# Patient Record
Sex: Female | Born: 1995 | Race: White | Hispanic: No | Marital: Single | Smoking: Never smoker
Health system: Southern US, Community
[De-identification: ages and names within clinical notes are randomized; demographics above are authoritative.]

---
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
## 2017-07-30 ENCOUNTER — Ambulatory Visit (INDEPENDENT_AMBULATORY_CARE_PROVIDER_SITE_OTHER): Payer: 59 | Admitting: Obstetrics and Gynecology

## 2017-07-30 ENCOUNTER — Other Ambulatory Visit (HOSPITAL_COMMUNITY)
Admission: RE | Admit: 2017-07-30 | Discharge: 2017-07-30 | Disposition: A | Discharge status: Home / Self Care | Payer: 59 | Source: Ambulatory Visit | Attending: Obstetrics and Gynecology | Admitting: Obstetrics and Gynecology

## 2017-07-30 ENCOUNTER — Encounter: Payer: Self-pay | Admitting: Obstetrics and Gynecology

## 2017-07-30 VITALS — BP 104/64 | HR 102 | Ht 61.0 in | Wt 111.0 lb

## 2017-07-30 DIAGNOSIS — B9689 Other specified bacterial agents as the cause of diseases classified elsewhere: Secondary | ICD-10-CM

## 2017-07-30 DIAGNOSIS — Z113 Encounter for screening for infections with a predominantly sexual mode of transmission: Secondary | ICD-10-CM

## 2017-07-30 DIAGNOSIS — N760 Acute vaginitis: Secondary | ICD-10-CM | POA: Diagnosis not present

## 2017-07-30 LAB — POCT WET PREP WITH KOH
CLUE CELLS WET PREP PER HPF POC: POSITIVE
KOH Prep POC: POSITIVE — AB
Trichomonas, UA: NEGATIVE
Yeast Wet Prep HPF POC: NEGATIVE

## 2017-07-30 MED ORDER — METRONIDAZOLE 500 MG PO TABS: 500.0000 mg | ORAL_TABLET | Freq: Two times a day (BID) | ORAL | 0 refills | Status: AC

## 2017-07-30 NOTE — Patient Instructions (Signed)
I value your feedback and entrusting us with your care. If you get a [place] patient survey, I would appreciate you taking the time to let us know about your experience today. Thank you! 

## 2017-07-30 NOTE — Progress Notes (Signed)
Patient, No Pcp Per   Chief Complaint  Patient presents with  . Vaginal Discharge    odor    HPI:      Ms. Megan Burns is a 22 y.o. No obstetric history on file. who LMP was Patient's last menstrual period was 07/16/2017 (approximate)., presents today for increased vag d/c with odor, no itch for the past 1 1/2 wks. No hx of BV in past. Has been on abx recently. No LBP, belly pain, fevers. She is sex active, with new partner. Neg STD testing within the yr.    History reviewed. No pertinent past medical history.  History reviewed. No pertinent surgical history.  History reviewed. No pertinent family history.  Social History   Socioeconomic History  . Marital status: Single    Spouse name: Not on file  . Number of children: Not on file  . Years of education: Not on file  . Highest education level: Not on file  Occupational History  . Not on file  Social Needs  . Financial resource strain: Not on file  . Food insecurity:    Worry: Not on file    Inability: Not on file  . Transportation needs:    Medical: Not on file    Non-medical: Not on file  Tobacco Use  . Smoking status: Never Smoker  . Smokeless tobacco: Never Used  Substance and Sexual Activity  . Alcohol use: No  . Drug use: No  . Sexual activity: Yes  Lifestyle  . Physical activity:    Days per week: Not on file    Minutes per session: Not on file  . Stress: Not on file  Relationships  . Social connections:    Talks on phone: Not on file    Gets together: Not on file    Attends religious service: Not on file    Active member of club or organization: Not on file    Attends meetings of clubs or organizations: Not on file    Relationship status: Not on file  . Intimate partner violence:    Fear of current or ex partner: Not on file    Emotionally abused: Not on file    Physically abused: Not on file    Forced sexual activity: Not on file  Other Topics Concern  . Not on file  Social History  Narrative  . Not on file    Outpatient Medications Prior to Visit  Medication Sig Dispense Refill  . Norgestimate-Ethinyl Estradiol Triphasic (TRI-LO-SPRINTEC) 0.18/0.215/0.25 MG-25 MCG tab Take 1 tablet by mouth daily.     No facility-administered medications prior to visit.       ROS:  Review of Systems  Constitutional: Negative for fever.  Gastrointestinal: Negative for blood in stool, constipation, diarrhea, nausea and vomiting.  Genitourinary: Positive for vaginal discharge. Negative for dyspareunia, dysuria, flank pain, frequency, hematuria, urgency, vaginal bleeding and vaginal pain.  Musculoskeletal: Negative for back pain.  Skin: Negative for rash.   BREAST: No symptoms   OBJECTIVE:   Vitals:  BP 104/64 (BP Location: Left Arm, Patient Position: Sitting, Cuff Size: Normal)   Pulse (!) 102   Ht 5\' 1"  (1.549 m)   Wt 111 lb (50.3 kg)   LMP 07/16/2017 (Approximate)   SpO2 99%   BMI 20.97 kg/m   Physical Exam  Constitutional: She is oriented to person, place, and time. Vital signs are normal. She appears well-developed.  Pulmonary/Chest: Effort normal.  Genitourinary: Vagina normal and uterus normal. There  is no rash, tenderness or lesion on the right labia. There is no rash, tenderness or lesion on the left labia. Uterus is not enlarged and not tender. Cervix exhibits no motion tenderness. Right adnexum displays no mass and no tenderness. Left adnexum displays no mass and no tenderness. No erythema or tenderness in the vagina. No vaginal discharge found.  Musculoskeletal: Normal range of motion.  Neurological: She is alert and oriented to person, place, and time.  Psychiatric: She has a normal mood and affect. Her behavior is normal. Thought content normal.  Vitals reviewed.   Results: Results for orders placed or performed in visit on 07/30/17 (from the past 24 hour(s))  POCT Wet Prep with KOH     Status: Abnormal   Collection Time: 07/30/17 10:26 AM  Result  Value Ref Range   Trichomonas, UA Negative    Clue Cells Wet Prep HPF POC pos    Epithelial Wet Prep HPF POC  Few, Moderate, Many, Too numerous to count   Yeast Wet Prep HPF POC neg    Bacteria Wet Prep HPF POC  Few   RBC Wet Prep HPF POC     WBC Wet Prep HPF POC     KOH Prep POC Positive (A) Negative     Assessment/Plan: Bacterial vaginosis - Pos wet prep/sx. Rx flagyl. No EtoH. F/u prn. Will RF if sx recur. - Plan: POCT Wet Prep with KOH, metroNIDAZOLE (FLAGYL) 500 MG tablet  Screening for STD (sexually transmitted disease) - Plan: Cervicovaginal ancillary only    Meds ordered this encounter  Medications  . metroNIDAZOLE (FLAGYL) 500 MG tablet    Sig: Take 1 tablet (500 mg total) by mouth 2 (two) times daily for 7 days.    Dispense:  14 tablet    Refill:  0    Order Specific Question:   Supervising Provider    Answer:   Nadara MustardHARRIS, ROBERT P [[id number]]      Return if symptoms worsen or fail to improve.  Charie Pinkus B. Sakira Dahmer, PA-C 07/30/2017 10:27 AM

## 2017-07-31 LAB — CERVICOVAGINAL ANCILLARY ONLY
CHLAMYDIA, DNA PROBE: NEGATIVE
Neisseria Gonorrhea: NEGATIVE
Trichomonas: NEGATIVE
